# Patient Record
Sex: Male | Born: 1960 | Race: White | Hispanic: No | Marital: Single | Smoking: Former smoker
Health system: Southern US, Community
[De-identification: ages and names within clinical notes are randomized; demographics above are authoritative.]

## PROBLEM LIST (undated history)

## (undated) DIAGNOSIS — E039 Hypothyroidism, unspecified: Secondary | ICD-10-CM

## (undated) DIAGNOSIS — L738 Other specified follicular disorders: Secondary | ICD-10-CM

## (undated) HISTORY — DX: Other specified follicular disorders: L73.8

## (undated) HISTORY — DX: Hypothyroidism, unspecified: E03.9

---

## 2013-01-06 ENCOUNTER — Ambulatory Visit
Admission: RE | Admit: 2013-01-06 | Discharge: 2013-01-06 | Disposition: A | Discharge status: Home / Self Care | Payer: Managed Care, Other (non HMO) | Source: Ambulatory Visit | Attending: Emergency Medicine | Admitting: Emergency Medicine

## 2013-01-06 ENCOUNTER — Other Ambulatory Visit: Payer: Self-pay | Admitting: Emergency Medicine

## 2013-01-06 DIAGNOSIS — R52 Pain, unspecified: Secondary | ICD-10-CM

## 2013-09-14 ENCOUNTER — Ambulatory Visit
Admission: RE | Admit: 2013-09-14 | Discharge: 2013-09-14 | Disposition: A | Discharge status: Home / Self Care | Payer: Managed Care, Other (non HMO) | Source: Ambulatory Visit | Attending: Emergency Medicine | Admitting: Emergency Medicine

## 2013-09-14 ENCOUNTER — Other Ambulatory Visit: Payer: Self-pay | Admitting: Emergency Medicine

## 2013-09-14 DIAGNOSIS — M1990 Unspecified osteoarthritis, unspecified site: Secondary | ICD-10-CM

## 2014-02-28 ENCOUNTER — Other Ambulatory Visit: Payer: Self-pay | Admitting: Emergency Medicine

## 2014-02-28 DIAGNOSIS — M542 Cervicalgia: Secondary | ICD-10-CM

## 2014-03-04 ENCOUNTER — Other Ambulatory Visit: Payer: Managed Care, Other (non HMO)

## 2014-03-10 ENCOUNTER — Inpatient Hospital Stay: Admission: RE | Admit: 2014-03-10 | Payer: Managed Care, Other (non HMO) | Source: Ambulatory Visit

## 2014-03-20 ENCOUNTER — Other Ambulatory Visit: Payer: Managed Care, Other (non HMO)

## 2014-03-24 ENCOUNTER — Ambulatory Visit (INDEPENDENT_AMBULATORY_CARE_PROVIDER_SITE_OTHER): Payer: Managed Care, Other (non HMO) | Admitting: Nurse Practitioner

## 2014-03-24 ENCOUNTER — Encounter: Payer: Self-pay | Admitting: Nurse Practitioner

## 2014-03-24 VITALS — BP 124/78 | HR 89

## 2014-03-24 DIAGNOSIS — E039 Hypothyroidism, unspecified: Secondary | ICD-10-CM

## 2014-03-24 DIAGNOSIS — R079 Chest pain, unspecified: Secondary | ICD-10-CM

## 2014-03-24 NOTE — Progress Notes (Signed)
Exercise Treadmill Test  Pre-Exercise Testing Evaluation Rhythm: normal sinus  Rate: 78 bpm     Test  Exercise Tolerance Test Ordering MD: Dietrich PatesPaula Ross, MD  Interpreting MD: Norma FredricksonLori Gerhardt, NP  Unique Test No: 1  Treadmill:  1  Indication for ETT: chest pain - rule out ischemia  Contraindication to ETT: No   Stress Modality: exercise - treadmill  Cardiac Imaging Performed: non   Protocol: standard Bruce - maximal  Max BP:  184/71  Max MPHR (bpm):  170 85% MPR (bpm):  145  MPHR obtained (bpm):  155 % MPHR obtained:  90%  Reached 85% MPHR (min:sec):  7:44 Total Exercise Time (min-sec):  10 minutes  Workload in METS:  11.5 Borg Scale: 14  Reason ETT Terminated:  patient's desire to stop    ST Segment Analysis At Rest: normal ST segments - no evidence of significant ST depression With Exercise: no evidence of significant ST depression  Other Information Arrhythmia:  No Angina during ETT:  absent (0) Quality of ETT:  diagnostic  ETT Interpretation:  normal - no evidence of ischemia by ST analysis  Comments: Patient presents today for routine GXT. Has had atypical chest pain - referred by PCP. No risk factors.    Today the patient exercised on the standard Bruce protocol for a total of 10 minutes.  Good exercise tolerance.  Adequate blood pressure response.  Clinically negative for chest pain. Test was stopped due to achievement of target HR.  EKG negative for ischemia. No significant arrhythmia noted.   Recommendations: CV risk factor modification  See back prn  Patient is agreeable to this plan and will call if any problems develop in the interim.   Rosalio MacadamiaLori C. Gerhardt, RN, ANP-C [facility] [address] [place], [place]  [id number] [phone]

## 2014-04-03 ENCOUNTER — Ambulatory Visit
Admission: RE | Admit: 2014-04-03 | Discharge: 2014-04-03 | Disposition: A | Discharge status: Home / Self Care | Payer: Managed Care, Other (non HMO) | Source: Ambulatory Visit | Attending: Emergency Medicine | Admitting: Emergency Medicine

## 2014-04-03 ENCOUNTER — Other Ambulatory Visit: Payer: Self-pay | Admitting: Emergency Medicine

## 2014-04-03 DIAGNOSIS — T1590XA Foreign body on external eye, part unspecified, unspecified eye, initial encounter: Secondary | ICD-10-CM

## 2014-04-03 DIAGNOSIS — M542 Cervicalgia: Secondary | ICD-10-CM

## 2015-01-03 ENCOUNTER — Ambulatory Visit
Admission: RE | Admit: 2015-01-03 | Discharge: 2015-01-03 | Disposition: A | Discharge status: Home / Self Care | Payer: Managed Care, Other (non HMO) | Source: Ambulatory Visit | Attending: Emergency Medicine | Admitting: Emergency Medicine

## 2015-01-03 ENCOUNTER — Other Ambulatory Visit: Payer: Self-pay | Admitting: Emergency Medicine

## 2015-01-03 DIAGNOSIS — M1990 Unspecified osteoarthritis, unspecified site: Secondary | ICD-10-CM

## 2015-01-25 ENCOUNTER — Other Ambulatory Visit: Payer: Self-pay | Admitting: Emergency Medicine

## 2015-01-25 DIAGNOSIS — R52 Pain, unspecified: Secondary | ICD-10-CM

## 2015-02-05 ENCOUNTER — Ambulatory Visit
Admission: RE | Admit: 2015-02-05 | Discharge: 2015-02-05 | Disposition: A | Discharge status: Home / Self Care | Payer: Managed Care, Other (non HMO) | Source: Ambulatory Visit | Attending: Emergency Medicine | Admitting: Emergency Medicine

## 2015-02-05 DIAGNOSIS — R52 Pain, unspecified: Secondary | ICD-10-CM

## 2015-02-05 MED ORDER — GADOBENATE DIMEGLUMINE 529 MG/ML IV SOLN
20.0000 mL | Freq: Once | INTRAVENOUS | Status: AC | PRN
  Administered 2015-02-05: 20 mL via INTRAVENOUS

## 2016-03-13 ENCOUNTER — Other Ambulatory Visit: Payer: Self-pay | Admitting: Emergency Medicine

## 2016-03-13 ENCOUNTER — Ambulatory Visit
Admission: RE | Admit: 2016-03-13 | Discharge: 2016-03-13 | Disposition: A | Discharge status: Home / Self Care | Payer: Managed Care, Other (non HMO) | Source: Ambulatory Visit | Attending: Emergency Medicine | Admitting: Emergency Medicine

## 2016-03-13 DIAGNOSIS — R0602 Shortness of breath: Secondary | ICD-10-CM

## 2017-03-24 IMAGING — CR DG LUMBAR SPINE COMPLETE 4+V
5 series · 5 of 5 positions shown · non-contrast
Comparison: None.

CLINICAL DATA: Initial encounter for left lateral hip and thigh
pain for 2 weeks.

EXAM:
LUMBAR SPINE - COMPLETE 4+ VIEW

[t lumbar spine ap]
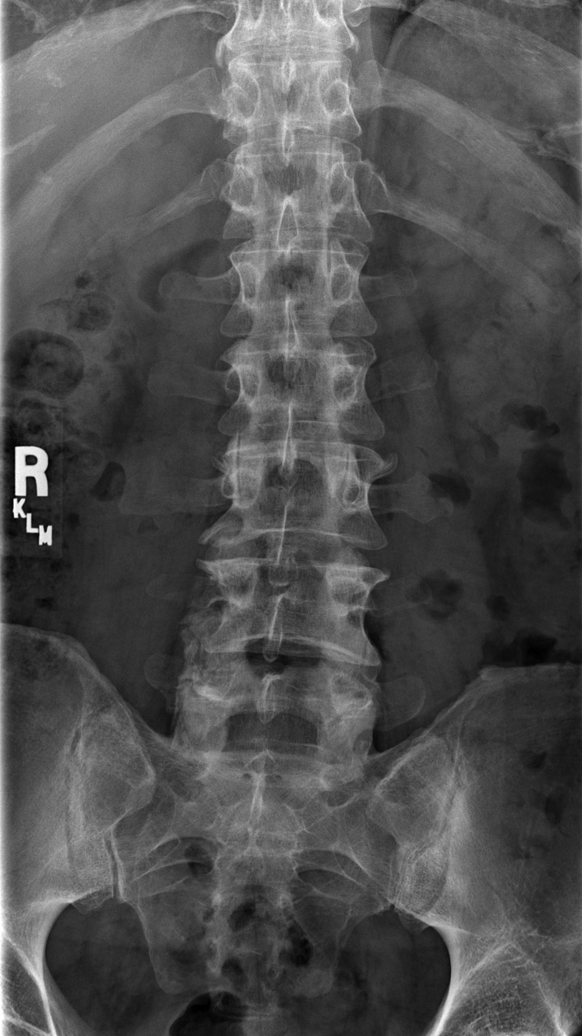

[t lumbar spine obl (1 of 2)]
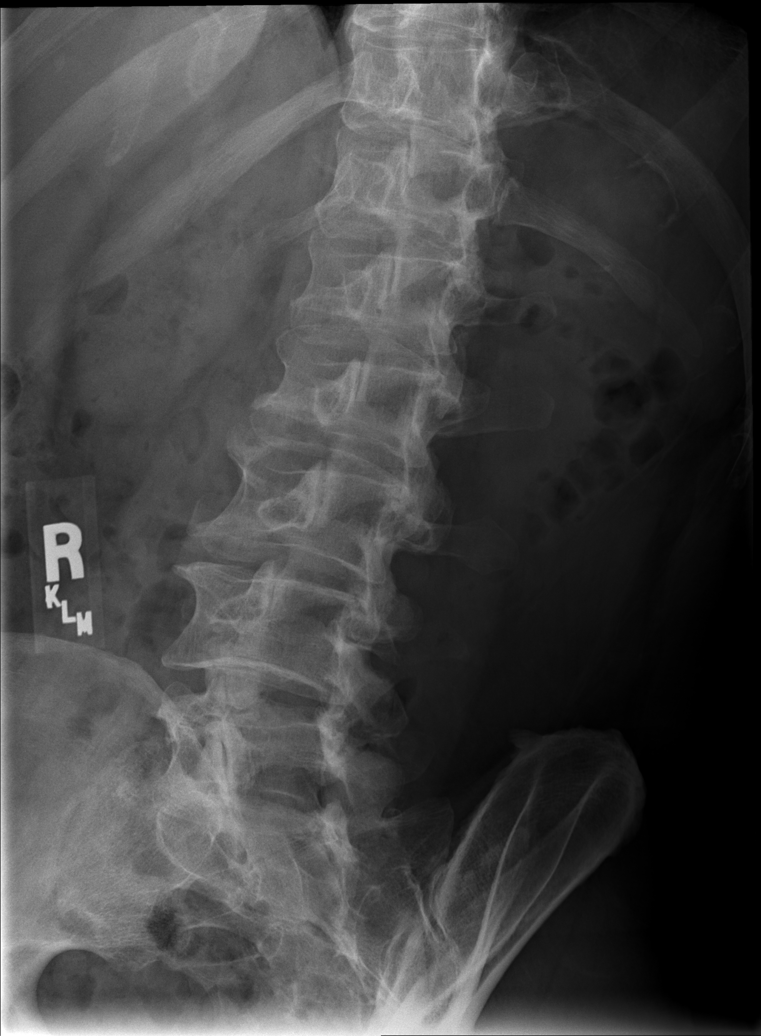

[t lumbar spine obl (2 of 2)]
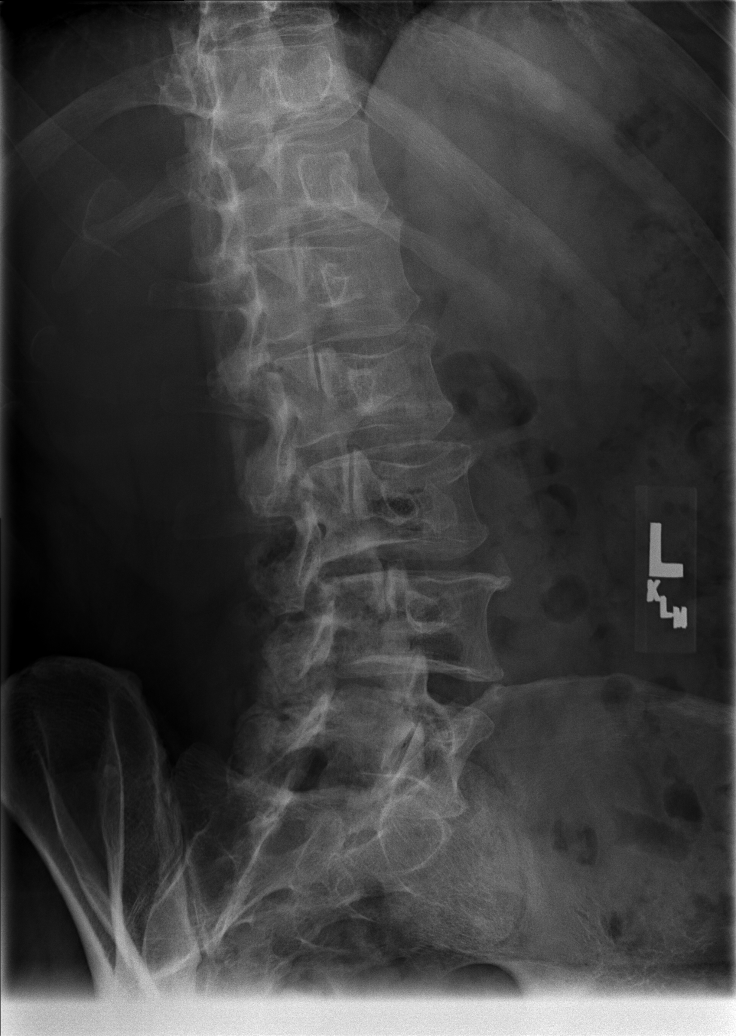

[t lumbar spine lat]
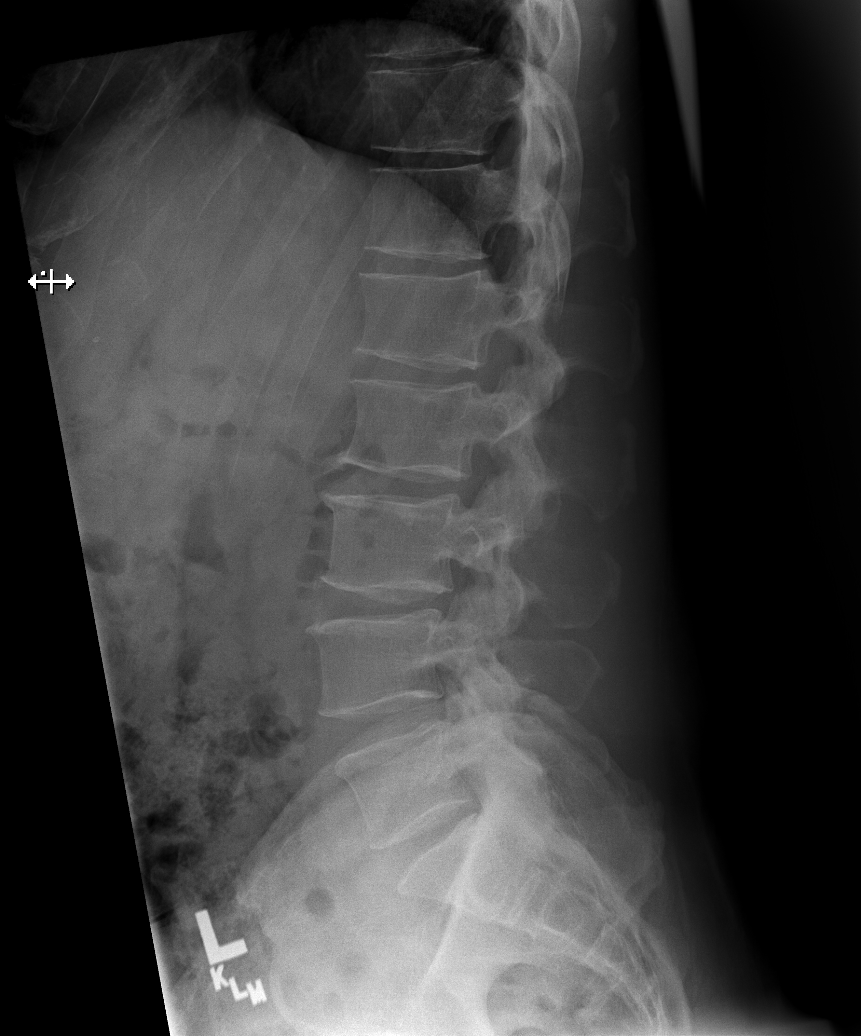

[t lumbar l-5 s-1 spot]
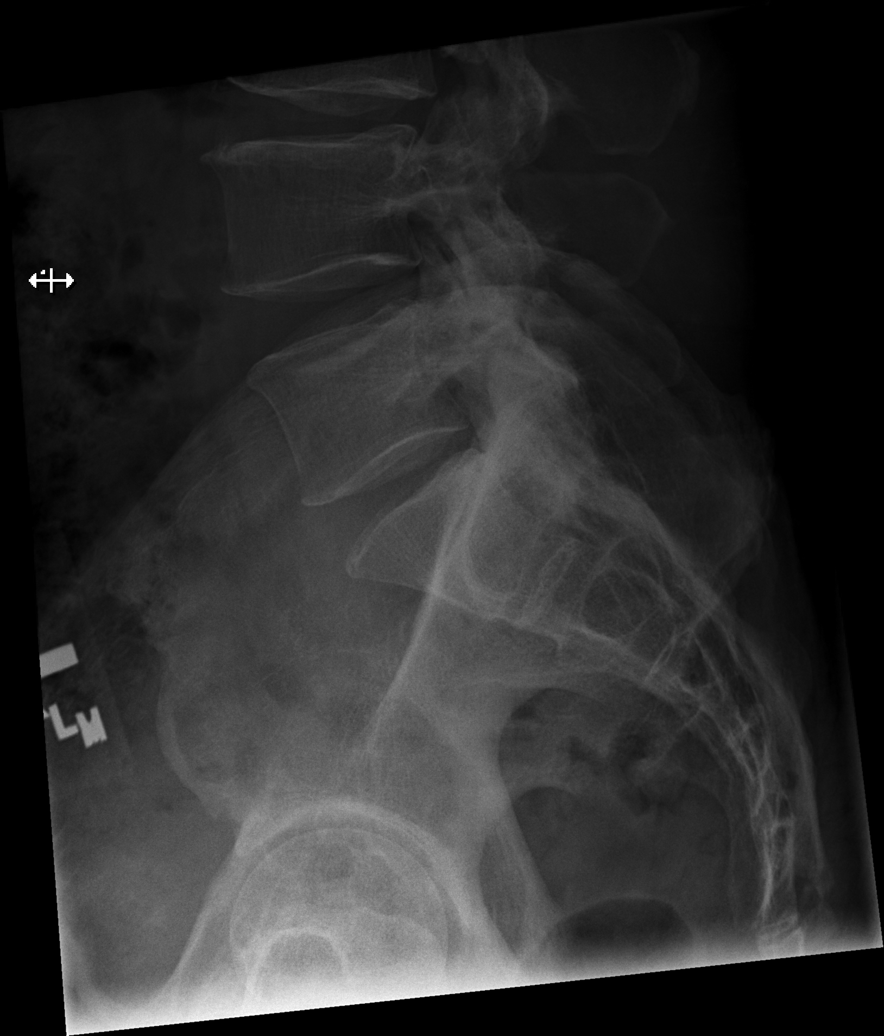

[5 of 5 positions shown; findings below may reference images not displayed]

FINDINGS: Minimal convex left lumbar spine curvature. Five lumbar type
vertebral bodies. Mild bilateral sacroiliac joint sclerosis, likely
degenerative. Maintenance of vertebral body height and alignment.
Facet arthropathy involves L4-5 and L5-S1. Intervertebral disc
heights are maintained.
IMPRESSION: Spondylosis, without acute osseous finding.
